# Patient Record
Sex: Female | Born: 1990
Health system: Southern US, Community
[De-identification: ages and names within clinical notes are randomized; demographics above are authoritative.]

## PROBLEM LIST (undated history)

## (undated) HISTORY — PX: CERVICAL SPINE SURGERY: SHX589

---
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
## 2008-03-12 ENCOUNTER — Emergency Department (HOSPITAL_COMMUNITY): Admission: EM | Admit: 2008-03-12 | Discharge: 2008-03-12 | Payer: Self-pay | Admitting: Emergency Medicine
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
## 2011-08-29 LAB — URINE MICROSCOPIC-ADD ON

## 2011-08-29 LAB — URINE CULTURE

## 2011-08-29 LAB — POCT PREGNANCY, URINE: Operator id: 146091

## 2011-08-29 LAB — URINALYSIS, ROUTINE W REFLEX MICROSCOPIC
Bilirubin Urine: NEGATIVE
Glucose, UA: NEGATIVE
Specific Gravity, Urine: 1.018

## 2014-10-20 ENCOUNTER — Other Ambulatory Visit: Payer: Self-pay | Admitting: Neurological Surgery

## 2014-10-20 DIAGNOSIS — M542 Cervicalgia: Secondary | ICD-10-CM

## 2014-12-31 ENCOUNTER — Other Ambulatory Visit: Payer: Self-pay | Admitting: Neurological Surgery

## 2014-12-31 DIAGNOSIS — M542 Cervicalgia: Secondary | ICD-10-CM

## 2015-01-11 ENCOUNTER — Ambulatory Visit (INDEPENDENT_AMBULATORY_CARE_PROVIDER_SITE_OTHER): Payer: PRIVATE HEALTH INSURANCE

## 2015-01-11 DIAGNOSIS — M542 Cervicalgia: Secondary | ICD-10-CM

## 2015-01-11 DIAGNOSIS — Z981 Arthrodesis status: Secondary | ICD-10-CM

## 2019-04-01 ENCOUNTER — Other Ambulatory Visit: Payer: Self-pay

## 2019-04-01 ENCOUNTER — Emergency Department (HOSPITAL_COMMUNITY)
Admission: EM | Admit: 2019-04-01 | Discharge: 2019-04-01 | Disposition: A | Discharge status: Home / Self Care | Payer: PRIVATE HEALTH INSURANCE | Attending: Emergency Medicine | Admitting: Emergency Medicine

## 2019-04-01 ENCOUNTER — Encounter (HOSPITAL_COMMUNITY): Payer: Self-pay | Admitting: *Deleted

## 2019-04-01 DIAGNOSIS — K0889 Other specified disorders of teeth and supporting structures: Secondary | ICD-10-CM

## 2019-04-01 DIAGNOSIS — K029 Dental caries, unspecified: Secondary | ICD-10-CM | POA: Insufficient documentation

## 2019-04-01 MED ORDER — PENICILLIN V POTASSIUM 500 MG PO TABS
500.0000 mg | ORAL_TABLET | Freq: Four times a day (QID) | ORAL | 0 refills | Status: AC
Start: 2019-04-01 — End: 2019-04-08

## 2019-04-01 MED ORDER — NAPROXEN 500 MG PO TABS: 500.0000 mg | ORAL_TABLET | Freq: Two times a day (BID) | ORAL | 0 refills | Status: AC

## 2019-04-01 NOTE — ED Notes (Signed)
Patient verbalizes understanding of discharge instructions . Opportunity for questions and answers were provided . Armband removed by staff ,Pt discharged from ED. W/C  offered at D/C  and Declined W/C at D/C and was escorted to lobby by RN.  

## 2019-04-01 NOTE — Discharge Instructions (Addendum)
You were evaluated in the Emergency Department and after careful evaluation, we did not find any emergent condition requiring admission or further testing in the hospital.  Your symptoms today seem to be due to a tooth infection.  Please take the antibiotics and anti-inflammatories as directed and follow-up with a dentist.  We have provided some local options.  Please return to the Emergency Department if you experience any worsening of your condition.  We encourage you to follow up with a primary care provider.  Thank you for allowing [place] to be a part of your care.

## 2019-04-01 NOTE — ED Provider Notes (Signed)
[facility] Emergency Department Provider Note MRN:  [id number]  Arrival date & time: 04/01/19     Chief Complaint   Dental Pain   History of Present Illness   Stacey Peterson is a 28 y.o. year-old female with no pertinent past medical history presenting to the ED with chief complaint of dental pain.  1 year of intermittent dental pain.  Located in the upper right molars.  Acutely worsened 1 week ago.  Pain is currently 7 out of 10, constant, worse with palpation.  Denies fever, no other complaints.  Review of Systems  A problem-focused ROS was performed. Positive for dental pain.  Patient denies fever.  Patient's Health History   History reviewed. No pertinent past medical history.  History reviewed. No pertinent surgical history.  No family history on file.  Social History   Socioeconomic History  . Marital status: Single    Spouse name: Not on file  . Number of children: Not on file  . Years of education: Not on file  . Highest education level: Not on file  Occupational History  . Not on file  Social Needs  . Financial resource strain: Not on file  . Food insecurity:    Worry: Not on file    Inability: Not on file  . Transportation needs:    Medical: Not on file    Non-medical: Not on file  Tobacco Use  . Smoking status: Never Smoker  . Smokeless tobacco: Never Used  Substance and Sexual Activity  . Alcohol use: Not Currently  . Drug use: Not Currently  . Sexual activity: Not on file  Lifestyle  . Physical activity:    Days per week: Not on file    Minutes per session: Not on file  . Stress: Not on file  Relationships  . Social connections:    Talks on phone: Not on file    Gets together: Not on file    Attends religious service: Not on file    Active member of club or organization: Not on file    Attends meetings of clubs or organizations: Not on file    Relationship status: Not on file  . Intimate partner violence:    Fear of  current or ex partner: Not on file    Emotionally abused: Not on file    Physically abused: Not on file    Forced sexual activity: Not on file  Other Topics Concern  . Not on file  Social History Narrative  . Not on file     Physical Exam  Vital Signs and Nursing Notes reviewed Vitals:   04/01/19 1527  BP: 131/84  Pulse: (!) 108  Resp: 17  Temp: 98.2 F (36.8 C)  SpO2: 100%    CONSTITUTIONAL: Well-appearing, NAD NEURO:  Alert and oriented x 3, no focal deficits EYES:  eyes equal and reactive ENT/NECK:  no LAD, no JVD; multiple dental caries to the right upper molars, with associated tenderness to palpation, no gingival abscess CARDIO: Regular rate, well-perfused, normal S1 and S2 PULM:  CTAB no wheezing or rhonchi GI/GU:  normal bowel sounds, non-distended, non-tender MSK/SPINE:  No gross deformities, no edema SKIN:  no rash, atraumatic PSYCH:  Appropriate speech and behavior  Diagnostic and Interventional Summary    Labs Reviewed - No data to display  No orders to display    Medications - No data to display   Procedures Critical Care  ED Course and Medical Decision Making  I have  reviewed the triage vital signs and the nursing notes.  Pertinent labs & imaging results that were available during my care of the patient were reviewed by me and considered in my medical decision making (see below for details).  Uncomplicated tooth infection due to dental cavities, no fever, nothing to suggest significant contiguous infection.  Penicillin, Naprosyn, dental follow-up.  After the discussed management above, the patient was determined to be safe for discharge.  The patient was in agreement with this plan and all questions regarding their care were answered.  ED return precautions were discussed and the patient will return to the ED with any significant worsening of condition.  Elmer SowMichael M. Pilar PlateBero, MD [facility]  [email or web address]  Final Clinical Impressions(s) / ED Diagnoses     ICD-10-CM   1. Pain, dental K08.89     ED Discharge Orders         Ordered    penicillin v potassium (VEETID) 500 MG tablet  4 times daily     04/01/19 1607    naproxen (NAPROSYN) 500 MG tablet  2 times daily     04/01/19 1607             Sabas SousBero, Adolphe Fortunato M, MD 04/01/19 1611

## 2019-04-01 NOTE — ED Triage Notes (Signed)
Pt in c/o R upper tooth pain onset x 1 yr worsening the last few days, denies fever, reports having holes in x2 teeth, A&O x4

## 2019-08-11 ENCOUNTER — Emergency Department (HOSPITAL_COMMUNITY)
Admission: EM | Admit: 2019-08-11 | Discharge: 2019-08-11 | Disposition: A | Discharge status: Home / Self Care | Payer: Self-pay | Attending: Emergency Medicine | Admitting: Emergency Medicine

## 2019-08-11 ENCOUNTER — Encounter (HOSPITAL_COMMUNITY): Payer: Self-pay | Admitting: Emergency Medicine

## 2019-08-11 ENCOUNTER — Other Ambulatory Visit: Payer: Self-pay

## 2019-08-11 DIAGNOSIS — H60332 Swimmer's ear, left ear: Secondary | ICD-10-CM | POA: Insufficient documentation

## 2019-08-11 MED ORDER — OFLOXACIN 0.3 % OT SOLN: 5.0000 [drp] | Freq: Two times a day (BID) | OTIC | 0 refills | Status: DC

## 2019-08-11 MED ORDER — CIPROFLOXACIN-DEXAMETHASONE 0.3-0.1 % OT SUSP
4.0000 [drp] | Freq: Once | OTIC | Status: AC
  Administered 2019-08-11: 4 [drp] via OTIC
  Filled 2019-08-11: qty 7.5

## 2019-08-11 NOTE — Discharge Instructions (Addendum)
Use prescribed drops as directed (use 4 drops in left ear twice a day for 7 days).   lay on unaffected ear for 5 minutes after applying eardrops to ear canal.  Abstain from water sports for 7 to 10 days.  Use ibuprofen and Tylenol for pain.

## 2019-08-11 NOTE — ED Provider Notes (Signed)
[facility] EMERGENCY DEPARTMENT Provider Note   CSN: [id number] Arrival date & time: 08/11/19  2139     History   Chief Complaint No chief complaint on file.   HPI Stacey Peterson is a 28 y.o. female. Patient is a 28 year old female with no pertinent medical problems presents today for left ear pain that began yesterday patient states it started suddenly while sitting on the couch as sharp pain with mildly decreased hearing.  Patient states she recently returned from a beach trip where she swam every day.  Patient also states she has been using Q-tips to attempt to clean the area and has gotten no significant discharge or debris out of her ear.  Patient states she feels it is some swelling in front of the ear.  Denies any headache, dizziness, itching, fevers, chills.    HPI  History reviewed. No pertinent past medical history.  There are no active problems to display for this patient.   Past Surgical History:  Procedure Laterality Date  . CERVICAL SPINE SURGERY     screws at C6     OB History   No obstetric history on file.      Home Medications    Prior to Admission medications   Medication Sig Start Date End Date Taking? Authorizing Provider  naproxen (NAPROSYN) 500 MG tablet Take 1 tablet (500 mg total) by mouth 2 (two) times daily. 04/01/19   Maudie Flakes, MD    Family History No family history on file.  Social History Social History   Tobacco Use  . Smoking status: Never Smoker  . Smokeless tobacco: Never Used  Substance Use Topics  . Alcohol use: Not Currently  . Drug use: Not Currently     Allergies   Patient has no known allergies.   Review of Systems Review of Systems  Constitutional: Negative for chills and fever.  HENT: Positive for ear pain. Negative for congestion, ear discharge, facial swelling, postnasal drip, rhinorrhea, sinus pain and sore throat.   Eyes: Negative for pain.  Respiratory: Negative for cough.    Cardiovascular: Negative for chest pain and leg swelling.  Gastrointestinal: Negative for abdominal pain and nausea.  Genitourinary: Negative for dysuria.  Musculoskeletal: Negative for neck pain.  Skin: Negative for rash.  Neurological: Negative for dizziness and headaches.     Physical Exam Updated Vital Signs BP 138/85 (BP Location: Right Arm)   Pulse 97   Temp 97.9 F (36.6 C) (Oral)   Resp 16   LMP 07/14/2019   SpO2 98%   Physical Exam Vitals signs and nursing note reviewed.  Constitutional:      General: She is not in acute distress. HENT:     Head: Normocephalic and atraumatic.     Right Ear: Tympanic membrane and external ear normal.     Left Ear: External ear normal.     Ears:     Comments: External auditory canal edematous with erythema.  Tympanic membrane intact.  Injected appearance.     Nose: Nose normal. No congestion.     Mouth/Throat:     Mouth: Mucous membranes are moist.     Pharynx: No oropharyngeal exudate or posterior oropharyngeal erythema.  Eyes:     General: No scleral icterus.       Right eye: No discharge.        Left eye: No discharge.     Conjunctiva/sclera: Conjunctivae normal.  Neck:     Musculoskeletal: Normal range of motion.  Cardiovascular:     Rate and Rhythm: Normal rate and regular rhythm.  Pulmonary:     Effort: Pulmonary effort is normal.  Abdominal:     General: There is no distension.  Musculoskeletal:     Right lower leg: No edema.     Left lower leg: No edema.  Lymphadenopathy:     Cervical: No cervical adenopathy.  Skin:    General: Skin is warm and dry.  Neurological:     Mental Status: She is alert. Mental status is at baseline.  Psychiatric:        Mood and Affect: Mood normal.        Behavior: Behavior normal.      ED Treatments / Results  Labs (all labs ordered are listed, but only abnormal results are displayed) Labs Reviewed - No data to display  EKG None  Radiology No results found.   Procedures Procedures (including critical care time)  Medications Ordered in ED Medications - No data to display   Initial Impression / Assessment and Plan / ED Course  I have reviewed the triage vital signs and the nursing notes.  Pertinent labs & imaging results that were available during my care of the patient were reviewed by me and considered in my medical decision making (see chart for details).        Patient is an otherwise healthy 28 year old female presenting with left earache after recent beach trip.   Physical exam and history supports a diagnosis of otitis externa.  Patient will be discharged with Ciprodex instructions to use.  Earwick placed.  Patient will use over-the-counter pain medication.   Patient has no historical or physical exam findings concerning for systemic infection or malignant otitis externa.  Patient given return precautions  Final Clinical Impressions(s) / ED Diagnoses   Final diagnoses:  None    ED Discharge Orders    None       Gailen ShelterFondaw, Jaquell Seddon S, [place] 08/11/19 2321    Geoffery Lyonselo, [place], MD 08/11/19 2329

## 2019-08-11 NOTE — ED Triage Notes (Signed)
Pt reports left ear pain x 1 day that began out of nowhere.  Reports a recent beach trip where she swam in the pool and thinks it may be related. Denies injury. Reports hearing is muffled.

## 2019-08-26 ENCOUNTER — Encounter (HOSPITAL_COMMUNITY): Payer: Self-pay | Admitting: Emergency Medicine

## 2019-08-26 ENCOUNTER — Emergency Department (HOSPITAL_COMMUNITY): Payer: Self-pay

## 2019-08-26 ENCOUNTER — Other Ambulatory Visit: Payer: Self-pay

## 2019-08-26 ENCOUNTER — Emergency Department (HOSPITAL_COMMUNITY)
Admission: EM | Admit: 2019-08-26 | Discharge: 2019-08-27 | Disposition: A | Discharge status: Home / Self Care | Payer: Self-pay | Attending: Emergency Medicine | Admitting: Emergency Medicine

## 2019-08-26 DIAGNOSIS — R079 Chest pain, unspecified: Secondary | ICD-10-CM | POA: Insufficient documentation

## 2019-08-26 DIAGNOSIS — Z5321 Procedure and treatment not carried out due to patient leaving prior to being seen by health care provider: Secondary | ICD-10-CM | POA: Insufficient documentation

## 2019-08-26 LAB — BASIC METABOLIC PANEL
Anion gap: 9 (ref 5–15)
BUN: 13 mg/dL (ref 6–20)
CO2: 26 mmol/L (ref 22–32)
Calcium: 9.3 mg/dL (ref 8.9–10.3)
Chloride: 102 mmol/L (ref 98–111)
Creatinine, Ser: 0.95 mg/dL (ref 0.44–1.00)
GFR calc Af Amer: >60 mL/min (ref >60)
GFR calc non Af Amer: >60 mL/min (ref >60)
Glucose, Bld: 109 mg/dL — ABNORMAL HIGH (ref 70–99)
Potassium: 4 mmol/L (ref 3.5–5.1)
Sodium: 137 mmol/L (ref 135–145)

## 2019-08-26 LAB — CBC
HCT: 40.1 % (ref 36.0–46.0)
Hemoglobin: 13 g/dL (ref 12.0–15.0)
MCH: 26.9 pg (ref 26.0–34.0)
MCHC: 32.4 g/dL (ref 30.0–36.0)
MCV: 82.9 fL (ref 80.0–100.0)
Platelets: 457 10*3/uL — ABNORMAL HIGH (ref 150–400)
RBC: 4.84 MIL/uL (ref 3.87–5.11)
RDW: 13.5 % (ref 11.5–15.5)
WBC: 9.5 10*3/uL (ref 4.0–10.5)
nRBC: 0 % (ref 0.0–0.2)

## 2019-08-26 LAB — I-STAT BETA HCG BLOOD, ED (MC, WL, AP ONLY): I-stat hCG, quantitative: <5 m[IU]/mL (ref <5)

## 2019-08-26 LAB — TROPONIN I (HIGH SENSITIVITY): Troponin I (High Sensitivity): 2 ng/L (ref <18)

## 2019-08-26 MED ORDER — SODIUM CHLORIDE 0.9% FLUSH: 3.0000 mL | Freq: Once | INTRAVENOUS | Status: DC

## 2019-08-26 NOTE — ED Triage Notes (Signed)
Pt reports deep left sided chest pain that radiates to her back, pain began Sunday night. Denies cough or sob. resp e/u, nad.

## 2020-11-10 ENCOUNTER — Telehealth: Payer: Self-pay | Admitting: Family

## 2020-11-10 DIAGNOSIS — R197 Diarrhea, unspecified: Secondary | ICD-10-CM

## 2020-11-10 NOTE — Progress Notes (Signed)
We are sorry that you are not feeling well.  Here is how we plan to help!  Based on what you have shared with me it looks like you have Acute Infectious Diarrhea.  Most cases of acute diarrhea are due to infections with virus and bacteria and are self-limited conditions lasting less than 14 days.  For your symptoms you may take Imodium 2 mg tablets that are over the counter at your local pharmacy. Take two tablet now and then one after each loose stool up to 6 a day.  Antibiotics are not needed for most people with diarrhea.  HOME CARE  We recommend changing your diet to help with your symptoms for the next few days.  Drink plenty of fluids that contain water salt and sugar. Sports drinks such as Gatorade may help.   You may try broths, soups, bananas, applesauce, soft breads, mashed potatoes or crackers.   You are considered infectious for as long as the diarrhea continues. Hand washing or use of alcohol based hand sanitizers is recommend.  It is best to stay out of work or school until your symptoms stop.   GET HELP RIGHT AWAY  If you have dark yellow colored urine or do not pass urine frequently you should drink more fluids.    If your symptoms worsen   If you feel like you are going to pass out (faint)  You have a new problem  MAKE SURE YOU   Understand these instructions.  Will watch your condition.  Will get help right away if you are not doing well or get worse.  Your e-visit answers were reviewed by a board certified advanced clinical practitioner to complete your personal care plan.  Depending on the condition, your plan could have included both over the counter or prescription medications.  If there is a problem please reply  once you have received a response from your provider.  Your safety is important to us.  If you have drug allergies check your prescription carefully.    You can use MyChart to ask questions about today's visit, request a non-urgent call  back, or ask for a work or school excuse for 24 hours related to this e-Visit. If it has been greater than 24 hours you will need to follow up with your provider, or enter a new e-Visit to address those concerns.   You will get an e-mail in the next two days asking about your experience.  I hope that your e-visit has been valuable and will speed your recovery. Thank you for using e-visits.   Approximately 5 minutes was spent documenting and reviewing patient's chart.
